# Patient Record
Sex: Male | Born: 1975 | Race: White | Hispanic: No | Marital: Married | Smoking: Former smoker
Health system: Southern US, Community
[De-identification: ages and names within clinical notes are randomized; demographics above are authoritative.]

---

## 2014-03-06 ENCOUNTER — Ambulatory Visit (INDEPENDENT_AMBULATORY_CARE_PROVIDER_SITE_OTHER): Payer: BLUE CROSS/BLUE SHIELD | Admitting: Family Medicine

## 2014-03-06 ENCOUNTER — Ambulatory Visit (INDEPENDENT_AMBULATORY_CARE_PROVIDER_SITE_OTHER): Payer: BLUE CROSS/BLUE SHIELD

## 2014-03-06 VITALS — BP 137/82 | HR 79 | Temp 98.6°F | Resp 16 | Ht 75.0 in | Wt 282.0 lb

## 2014-03-06 DIAGNOSIS — S92911A Unspecified fracture of right toe(s), initial encounter for closed fracture: Secondary | ICD-10-CM

## 2014-03-06 DIAGNOSIS — M25571 Pain in right ankle and joints of right foot: Secondary | ICD-10-CM

## 2014-03-06 IMAGING — CR DG FOOT COMPLETE 3+V*R*
3 series · 3 of 3 positions shown · non-contrast
Comparison: None.

CLINICAL DATA: Acute onset of right foot pain.  Initial encounter.

EXAM:
RIGHT FOOT COMPLETE - 3+ VIEW

[AP]
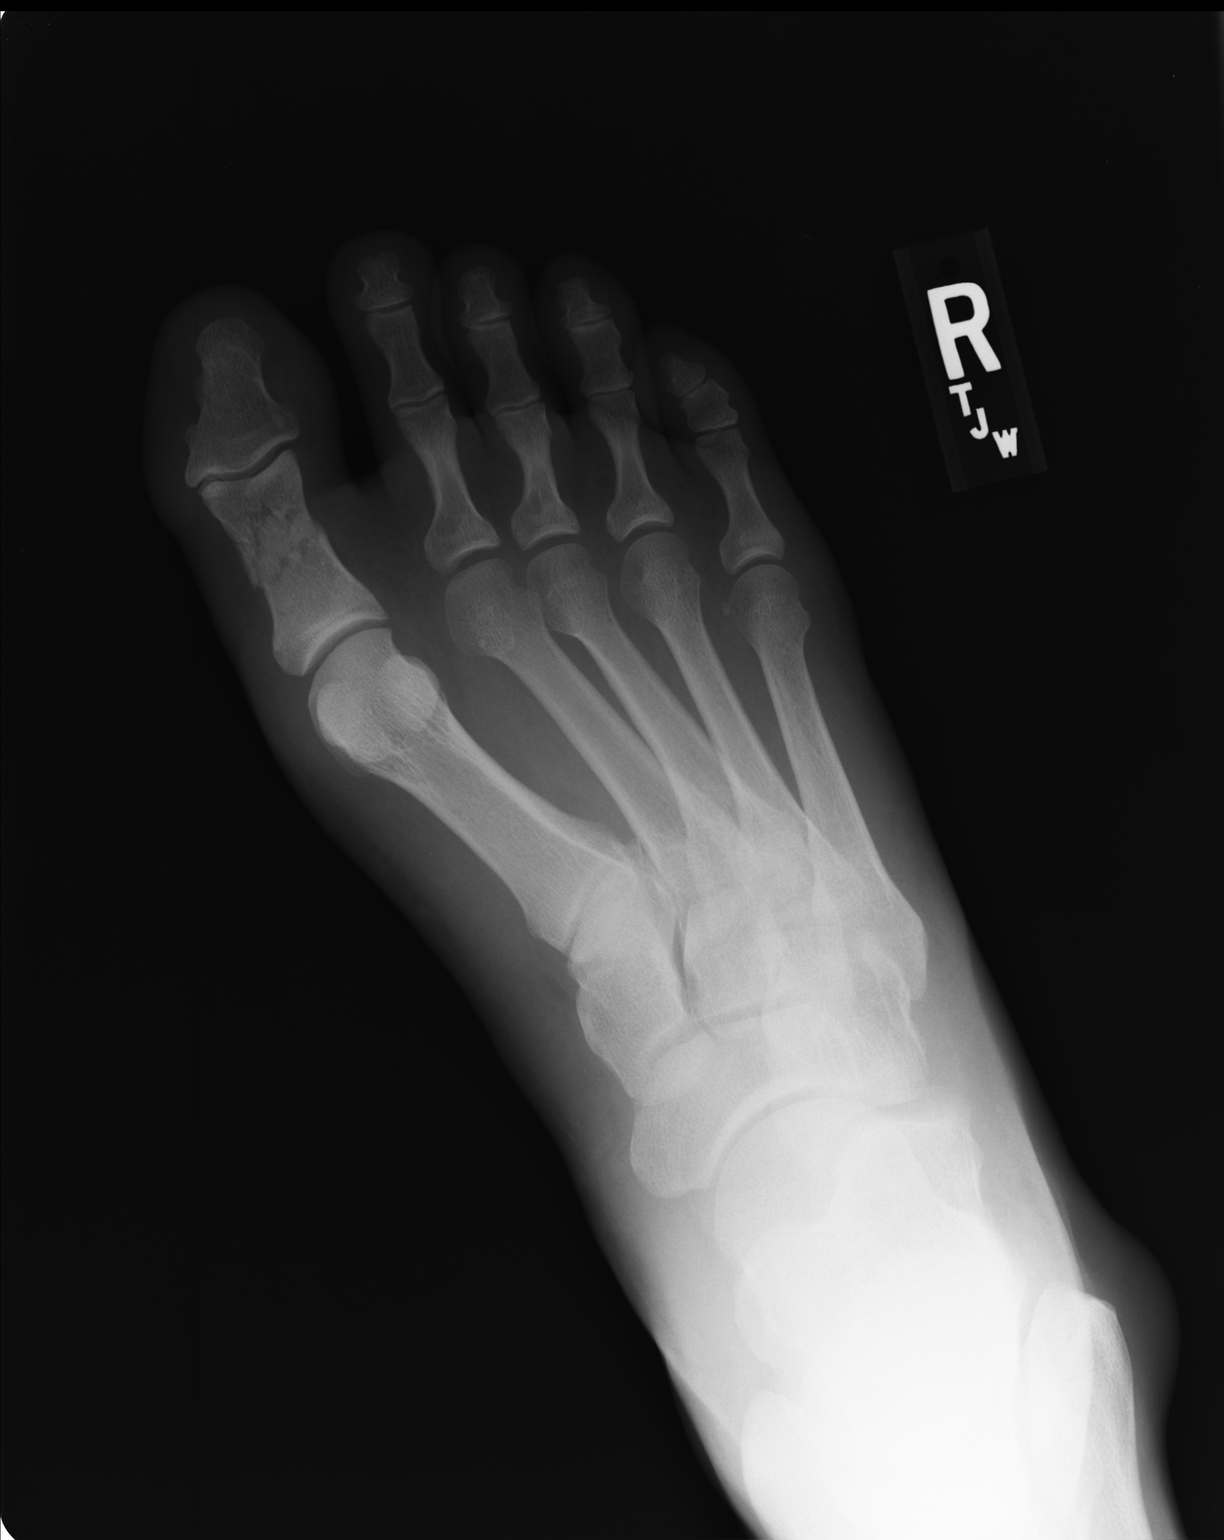

[ap obl int rot]
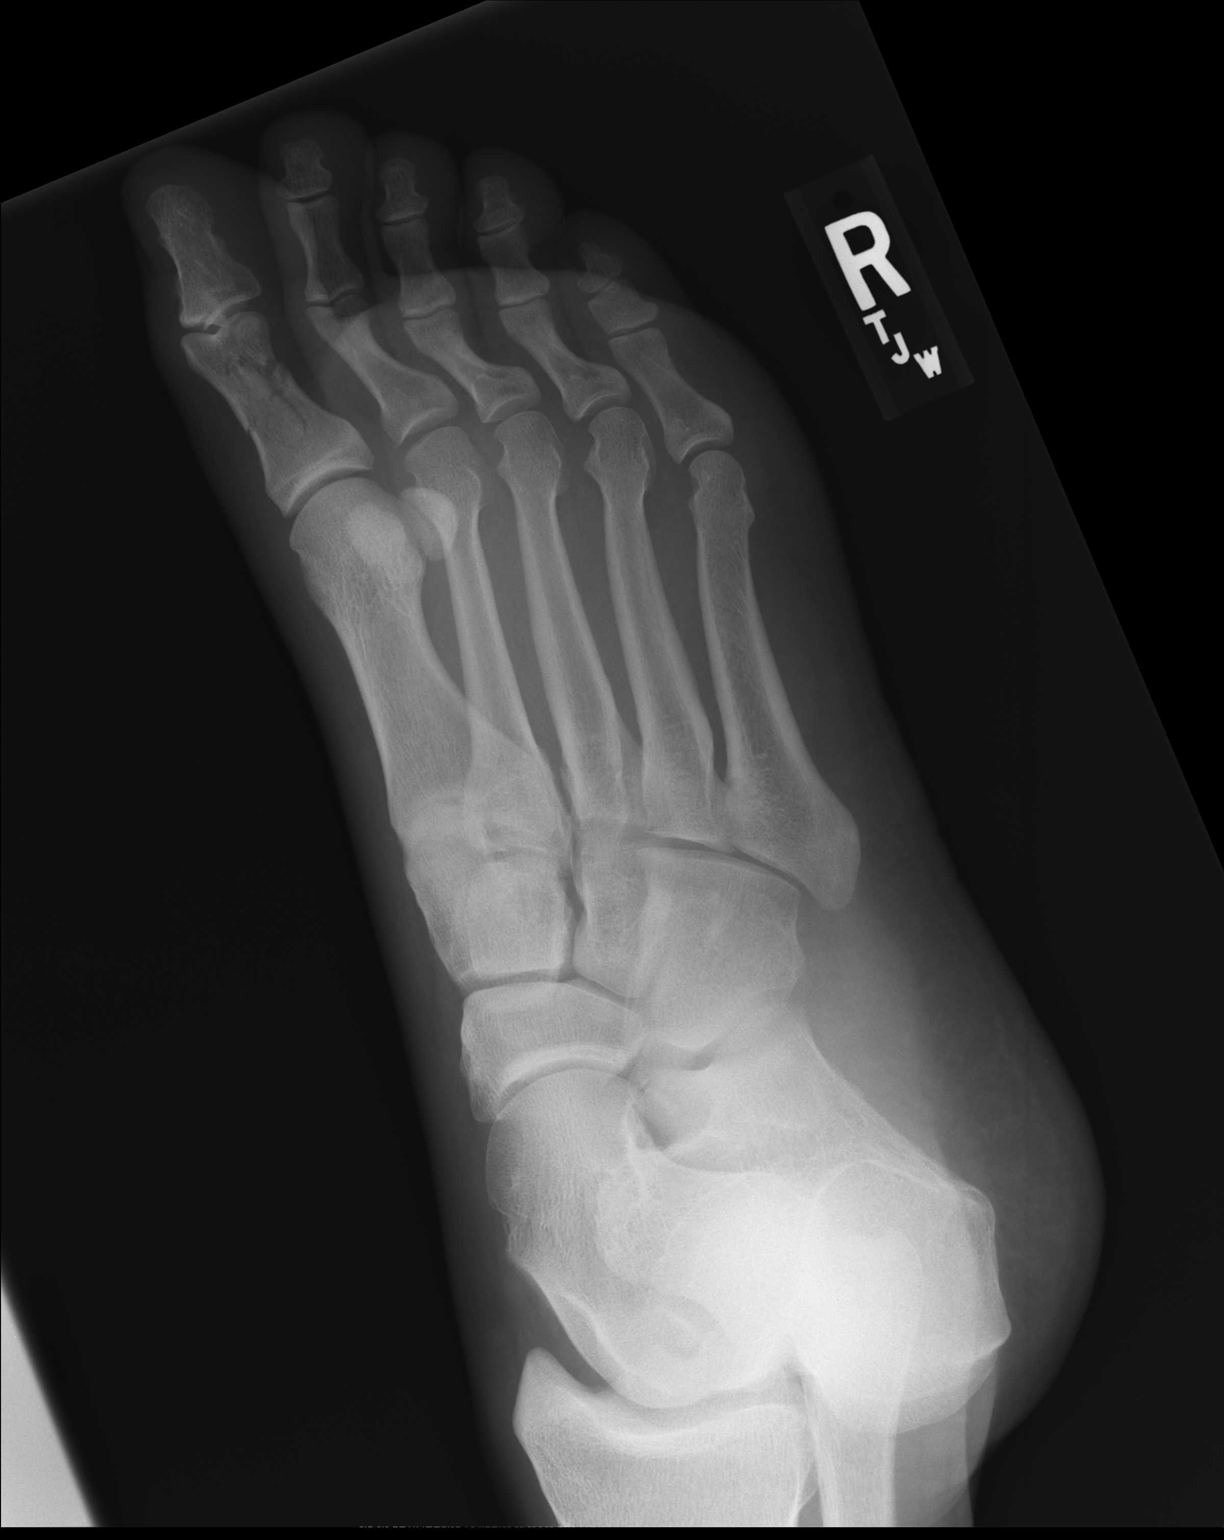

[lateral]
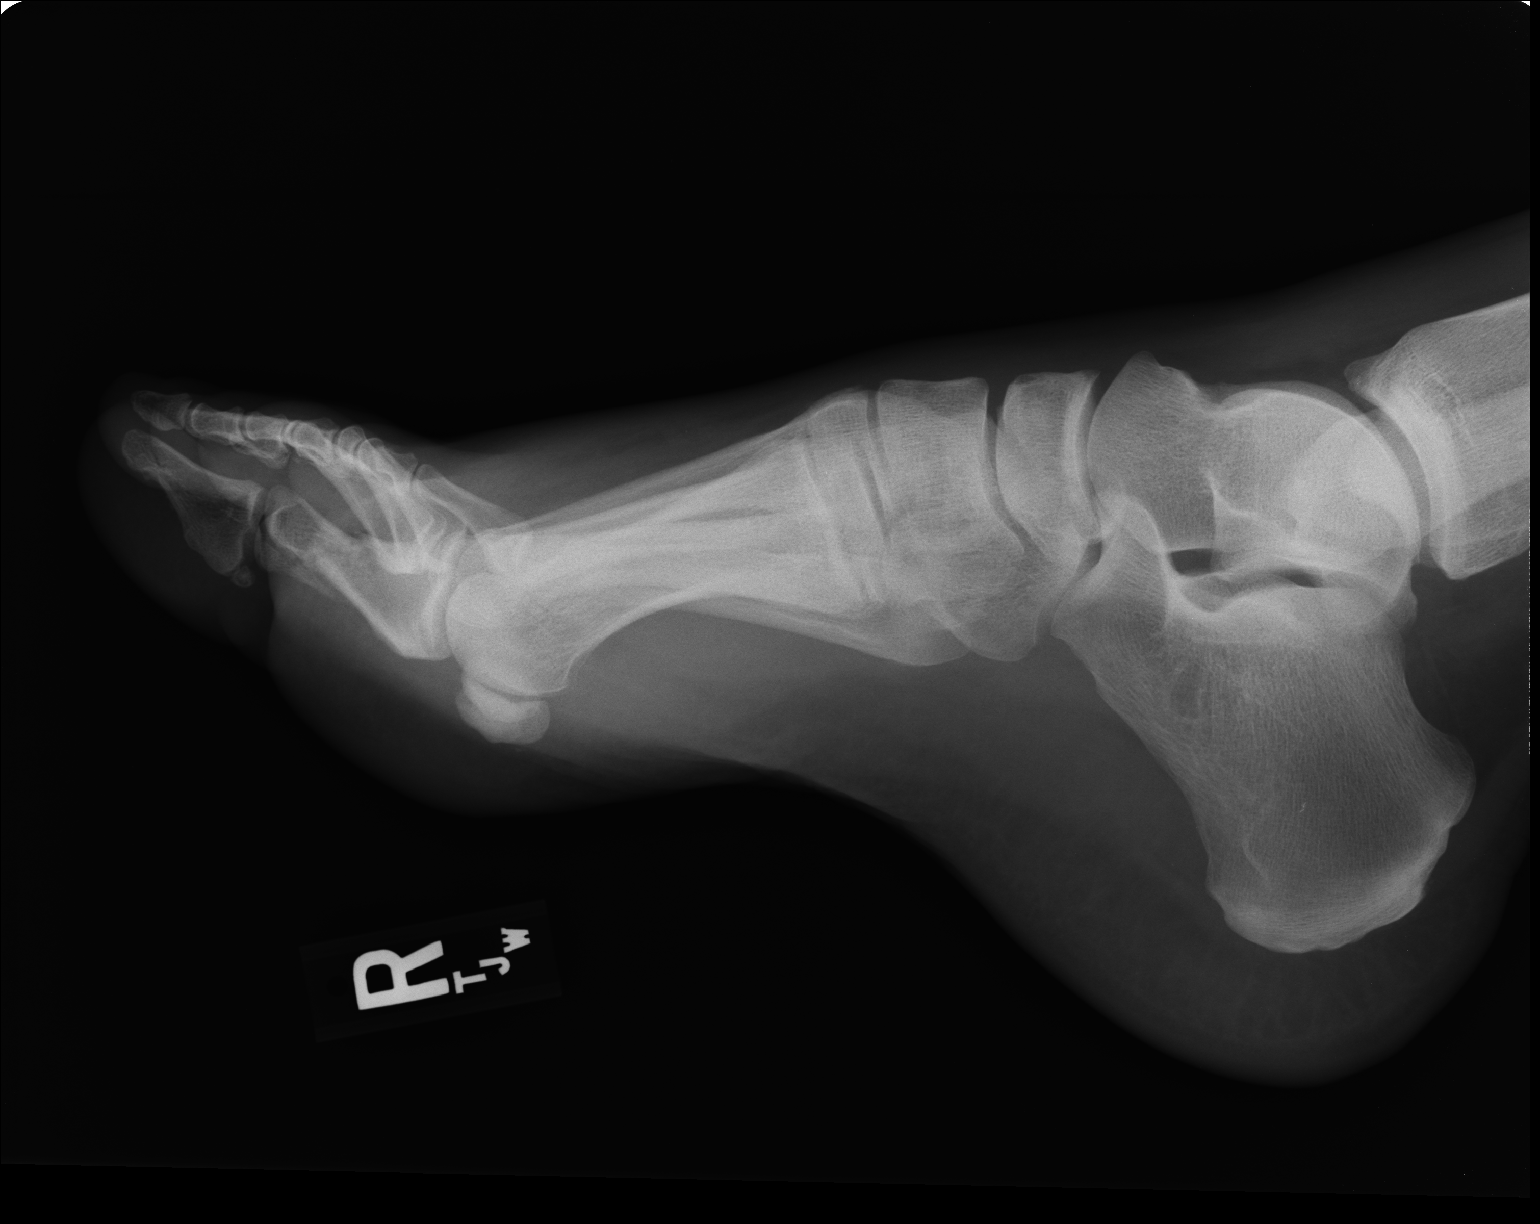

[3 of 3 positions shown; findings below may reference images not displayed]

FINDINGS: There is a comminuted fracture involving the first proximal phalanx,
with likely distal extension to the interphalangeal joint. No
definite proximal extension is seen. Mild associated plantar and
medial displacement is noted. Surrounding soft tissue swelling is
seen.

No additional fractures are identified. Visualized joint spaces are
otherwise preserved. The subtalar joint is unremarkable in
appearance.
IMPRESSION: Comminuted fracture involving the first proximal phalanx, with
likely distal extension to the interphalangeal joint. Mild
associated plantar and medial displacement noted.

## 2014-03-06 NOTE — Progress Notes (Signed)
Chief Complaint:  Chief Complaint  Patient presents with  . Toe Injury    200 lb object fell on big toe x 2 days ago    HPI: Daniel Molina is a 39 y.o. male who is here for  Foot pain and swelling after Railroad tie about 200 lbs fell on his foot, he has swelling and cannot bear weight, this happened  on Sunday He has been on crutches and also trying to elevate but sits at a desk all day, he denies numbness or tingling, swelling has gotten a little worse.  No prior injuries. He do esnot have pain, he ahs been takign ibuprofen for swelling. HE was not wearing steel toe shsoes, just wearing rubber boots due to snow  He is not a smoker. He drives about 3 hours to and from work in [place] so is not able to elevate as mucha s he would like  No past medical history on file. No past surgical history on file. History   Social History  . Marital Status: Married    Spouse Name: N/A    Number of Children: N/A  . Years of Education: N/A   Social History Main Topics  . Smoking status: Never Smoker   . Smokeless tobacco: None  . Alcohol Use: None  . Drug Use: None  . Sexual Activity: None   Other Topics Concern  . None   Social History Narrative  . None   No family history on file. No Known Allergies Prior to Admission medications   Not on File     ROS: The patient denies fevers, chills, night sweats, unintentional weight loss, chest pain, palpitations, wheezing, dyspnea on exertion, nausea, vomiting, abdominal pain, dysuria, hematuria, melena, numbness, weakness, or tingling.   All other systems have been reviewed and were otherwise negative with the exception of those mentioned in the HPI and as above.    PHYSICAL EXAM: Filed Vitals:   03/06/14 1814  BP: 137/82  Pulse: 79  Temp: 98.6 F (37 C)  Resp: 16   Filed Vitals:   03/06/14 1814  Height:  (1.905 m)  Weight: 282 lb (127.914 kg)   Body mass index is 35.25 kg/(m^2).  General: Alert, no acute  distress HEENT:  Normocephalic, atraumatic, oropharynx patent. EOMI, PERRLA Cardiovascular:  Regular rate and rhythm, no rubs murmurs or gallops.  No Carotid bruits, radial pulse intact. No pedal edema.  Respiratory: Clear to auscultation bilaterally.  No wheezes, rales, or rhonchi.  No cyanosis, no use of accessory musculature GI: No organomegaly, abdomen is soft and non-tender, positive bowel sounds.  No masses. Skin: + ecchymosis Neurologic: Facial musculature symmetric. Psychiatric: Patient is appropriate throughout our interaction. Lymphatic: No cervical lymphadenopathy Musculoskeletal: Gait intact. No e.o compartment syndrome, he has good DP and cap refill, sensation is normal Very tender at great toe  LABS: No results found for this or any previous visit.   EKG/XRAY:   Primary read interpreted by Dr. Conley Rolls at [facility]. Promximal phalanx fracture great toe multiple sites   ASSESSMENT/PLAN: Encounter Diagnoses  Name Primary?  . Pain in joint, ankle and foot, right   . Phalanx of the foot fracture, right, closed, initial encounter Yes   Post op shoe applied in office, may use Crutches he already has Nonweightbearing, advise to monitor for compartment syndrome since crush injury Did not want any pain meds Elevate as much as possible Refer to ortho  Gross sideeffects, risk and benefits, and alternatives of medications  d/w patient. Patient is aware that all medications have potential sideeffects and we are unable to predict every sideeffect or drug-drug interaction that may occur.  LE, THAO PHUONG, DO 03/06/2014 8:10 PM

## 2014-09-27 ENCOUNTER — Ambulatory Visit (INDEPENDENT_AMBULATORY_CARE_PROVIDER_SITE_OTHER): Payer: BLUE CROSS/BLUE SHIELD | Admitting: Family Medicine

## 2014-09-27 VITALS — BP 132/82 | HR 66 | Temp 98.5°F | Resp 16 | Ht 72.0 in | Wt 282.4 lb

## 2014-09-27 DIAGNOSIS — J351 Hypertrophy of tonsils: Secondary | ICD-10-CM | POA: Diagnosis not present

## 2014-09-27 DIAGNOSIS — R0683 Snoring: Secondary | ICD-10-CM

## 2014-09-27 DIAGNOSIS — R05 Cough: Secondary | ICD-10-CM

## 2014-09-27 DIAGNOSIS — R1310 Dysphagia, unspecified: Secondary | ICD-10-CM | POA: Diagnosis not present

## 2014-09-27 DIAGNOSIS — G4710 Hypersomnia, unspecified: Secondary | ICD-10-CM | POA: Diagnosis not present

## 2014-09-27 DIAGNOSIS — R400 Somnolence: Secondary | ICD-10-CM

## 2014-09-27 DIAGNOSIS — K219 Gastro-esophageal reflux disease without esophagitis: Secondary | ICD-10-CM | POA: Diagnosis not present

## 2014-09-27 DIAGNOSIS — R059 Cough, unspecified: Secondary | ICD-10-CM

## 2014-09-27 MED ORDER — RANITIDINE HCL 150 MG PO TABS: 150.0000 mg | ORAL_TABLET | Freq: Two times a day (BID) | ORAL | Status: AC

## 2014-09-27 NOTE — Progress Notes (Signed)
History and physical examinations obtained with Dr. Merla Riches.  Agree with assessment and plan. Kristi Paulita Fujita, M.D. [facility] [address] [place], [place]  [address] [phone] phone [phone] fax

## 2014-09-27 NOTE — Patient Instructions (Addendum)
Cough could be a combination of multiple things I think there is a component of reflux, irritated by smoking  Also the length of the uvula and large tonsils could be playing a part I am going to refer you to ENT for evaluation  For your snoring, stopping breathing at night, and daytime sleepiness I am going to refer you for a sleep study We will call you as soon as those appts are set up

## 2014-09-27 NOTE — Progress Notes (Signed)
Subjective:    Patient ID: Daniel Molina, male    DOB: [birth year], 39 y.o.   MRN: [id number]  HPI    Review of Systems     Objective:   Physical Exam        Assessment & Plan:   [facility] [address], [place] [address] [id number]  Date:  09/27/2014   Name:  Ralf Konopka   DOB:  10/07/1975   MRN:  [id number]  PCP:  Pcp Not In System    Chief Complaint: Cough   History of Present Illness:  Daniel Molina is a 39 y.o. 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, large tonsils, and deviated septum Feels like the papilloma has increased in size characterized as dry cough No trouble liquid but some trouble swallowing food x 2-3 years which he associates with reflux No fevers, no congestion, no ear pain, no facial pressure, no trouble with vision, no rhinorrhea No chest pain, no palpitations, no tachy/brady, no SOB, no abd pain/ no N/V/D Smokes 1 pack/week, cough worse with smoking Not on any medications No other medical issues  + reflux worse at night with burning and cough, takes tums with no relief Has been a chronic issue but only taking tums Trouble laying flat at night Trouble with spicy foods  +daytime somnolence, snoring at night, periods of apnea at night per pt's wife Never evaluated in the past      There are no active problems to display for this patient.   History reviewed. No pertinent past medical history.  History reviewed. No pertinent past surgical history.  Social History  Substance Use Topics  . Smoking status: Former Games developer  . Smokeless tobacco: None     Comment: smokes on and off  . Alcohol Use: No    History reviewed. No pertinent family history.  No Known Allergies  Medication list has been reviewed and updated.  No current outpatient prescriptions on file prior to visit.   No current  facility-administered medications on file prior to visit.    Review of Systems: All other pertinent ROS negative except as seen in the HPI above  Physical Examination: Filed Vitals:   09/27/14 1735  BP: 132/82  Pulse: 66  Temp: 98.5 F (36.9 C)  Resp: 16   Filed Vitals:   09/27/14 1735  Height: 6' (1.829 m)  Weight: 282 lb 6 oz (128.084 kg)   Body mass index is 38.29 kg/(m^2). Ideal Body Weight: Weight in (lb) to have BMI = 25: 183.9  GEN: WDWN, NAD, Non-toxic, A & O x 3 HEENT: Atraumatic, Normocephalic. Neck supple, No LAD. Large uvula hanging down onto tongue, tonsillar hypertrophy bilaterally with multiple tonsilliths, no signs of exudate, small appendage hanging of posterior left uvula, no pharyngeal erthema   Ears and Nose: No external deformity. Deviated septum, non erythematous turbinates CV: RRR, No M/G/R. No JVD. No thrill. No extra heart sounds. PULM: CTAB, no wheezes, crackles, rhonchi. No retractions. No resp. distress. No accessory muscle use. ABD: S, NT, ND, +BS. No rebound. No HSM. EXTR: No c/c/e NEURO Normal gait.  PSYCH: Normally interactive. Conversant. Not depressed or anxious appearing.  Calm demeanor.    Assessment and Plan: Gastroesophageal reflux disease, esophagitis presence not specified - Plan: ranitidine (ZANTAC) 150 MG tablet  Cough - Plan: Ambulatory referral to ENT  Difficulty swallowing -  Plan: Ambulatory referral to ENT  Lingual tonsil hypertrophy - Plan: Ambulatory referral to ENT  Daytime somnolence - Plan: Nocturnal polysomnography (NPSG)  Snoring - Plan: Nocturnal polysomnography (NPSG)  Cough likely a combination of multiple things There is a known component of reflux which is not well controlled with tums Will initiate trial of ranitidine 150 BID with consideration of moving up in class to PPI if no relief Likely the esophagus/oropharynx is irritated by smoking, counseled by quiting  I also believe the length of the uvula and  large tonsils could be playing a part Will refer pt to ENT for evaluation of tonsillar hypertrophy, uvular hypertrophy, lesions on uvula and deviated septum Will refer for sleep study to evaluate likely sleep apnea  For your snoring, stopping breathing at night, and daytime sleepiness I am going to refer you for a sleep study We will call you as soon as those appts are set up   Signed Ben Adams-Doolittle, DO

## 2014-10-02 ENCOUNTER — Other Ambulatory Visit: Payer: Self-pay

## 2014-10-02 DIAGNOSIS — R400 Somnolence: Secondary | ICD-10-CM

## 2014-10-02 DIAGNOSIS — R0683 Snoring: Secondary | ICD-10-CM

## 2014-12-17 ENCOUNTER — Institutional Professional Consult (permissible substitution): Payer: BLUE CROSS/BLUE SHIELD | Admitting: Neurology

## 2014-12-17 DIAGNOSIS — Z0289 Encounter for other administrative examinations: Secondary | ICD-10-CM

## 2017-02-22 DIAGNOSIS — M25512 Pain in left shoulder: Secondary | ICD-10-CM | POA: Diagnosis not present

## 2017-02-22 DIAGNOSIS — M25511 Pain in right shoulder: Secondary | ICD-10-CM | POA: Diagnosis not present

## 2017-02-22 DIAGNOSIS — M19011 Primary osteoarthritis, right shoulder: Secondary | ICD-10-CM | POA: Diagnosis not present

## 2017-03-24 DIAGNOSIS — M19011 Primary osteoarthritis, right shoulder: Secondary | ICD-10-CM | POA: Diagnosis not present

## 2017-03-24 DIAGNOSIS — M67911 Unspecified disorder of synovium and tendon, right shoulder: Secondary | ICD-10-CM | POA: Diagnosis not present

## 2017-04-01 DIAGNOSIS — M25511 Pain in right shoulder: Secondary | ICD-10-CM | POA: Diagnosis not present

## 2017-04-05 DIAGNOSIS — M19011 Primary osteoarthritis, right shoulder: Secondary | ICD-10-CM | POA: Diagnosis not present

## 2017-11-18 DIAGNOSIS — R591 Generalized enlarged lymph nodes: Secondary | ICD-10-CM | POA: Diagnosis not present

## 2017-11-18 DIAGNOSIS — Z23 Encounter for immunization: Secondary | ICD-10-CM | POA: Diagnosis not present

## 2017-11-18 DIAGNOSIS — Z720 Tobacco use: Secondary | ICD-10-CM | POA: Diagnosis not present

## 2017-11-18 DIAGNOSIS — J029 Acute pharyngitis, unspecified: Secondary | ICD-10-CM | POA: Diagnosis not present

## 2017-12-30 DIAGNOSIS — R221 Localized swelling, mass and lump, neck: Secondary | ICD-10-CM | POA: Diagnosis not present

## 2017-12-30 DIAGNOSIS — J039 Acute tonsillitis, unspecified: Secondary | ICD-10-CM | POA: Diagnosis not present

## 2018-04-07 DIAGNOSIS — Z87891 Personal history of nicotine dependence: Secondary | ICD-10-CM | POA: Diagnosis not present

## 2018-04-07 DIAGNOSIS — R221 Localized swelling, mass and lump, neck: Secondary | ICD-10-CM | POA: Diagnosis not present

## 2018-04-07 DIAGNOSIS — K219 Gastro-esophageal reflux disease without esophagitis: Secondary | ICD-10-CM | POA: Diagnosis not present

## 2018-04-18 ENCOUNTER — Other Ambulatory Visit: Payer: Self-pay | Admitting: Otolaryngology

## 2018-04-18 DIAGNOSIS — R221 Localized swelling, mass and lump, neck: Secondary | ICD-10-CM

## 2018-04-26 ENCOUNTER — Other Ambulatory Visit: Payer: Self-pay | Admitting: Otolaryngology

## 2018-04-26 DIAGNOSIS — R221 Localized swelling, mass and lump, neck: Secondary | ICD-10-CM

## 2018-04-28 ENCOUNTER — Ambulatory Visit
Admission: RE | Admit: 2018-04-28 | Discharge: 2018-04-28 | Disposition: A | Discharge status: Home / Self Care | Payer: BLUE CROSS/BLUE SHIELD | Source: Ambulatory Visit | Attending: Otolaryngology | Admitting: Otolaryngology

## 2018-04-28 DIAGNOSIS — R221 Localized swelling, mass and lump, neck: Secondary | ICD-10-CM | POA: Diagnosis not present

## 2018-04-28 MED ORDER — IOPAMIDOL (ISOVUE-300) INJECTION 61%
75.0000 mL | Freq: Once | INTRAVENOUS | Status: AC | PRN
  Administered 2018-04-28: 75 mL via INTRAVENOUS

## 2022-12-11 DIAGNOSIS — Z125 Encounter for screening for malignant neoplasm of prostate: Secondary | ICD-10-CM | POA: Diagnosis not present

## 2022-12-11 DIAGNOSIS — Z0000 Encounter for general adult medical examination without abnormal findings: Secondary | ICD-10-CM | POA: Diagnosis not present

## 2022-12-11 DIAGNOSIS — I10 Essential (primary) hypertension: Secondary | ICD-10-CM | POA: Diagnosis not present

## 2022-12-11 DIAGNOSIS — Z23 Encounter for immunization: Secondary | ICD-10-CM | POA: Diagnosis not present

## 2023-02-19 DIAGNOSIS — Z1211 Encounter for screening for malignant neoplasm of colon: Secondary | ICD-10-CM | POA: Diagnosis not present

## 2023-02-19 DIAGNOSIS — Z1212 Encounter for screening for malignant neoplasm of rectum: Secondary | ICD-10-CM | POA: Diagnosis not present

## 2023-10-19 ENCOUNTER — Other Ambulatory Visit: Payer: Self-pay

## 2023-10-19 ENCOUNTER — Ambulatory Visit (INDEPENDENT_AMBULATORY_CARE_PROVIDER_SITE_OTHER)

## 2023-10-19 ENCOUNTER — Encounter: Payer: Self-pay | Admitting: Emergency Medicine

## 2023-10-19 ENCOUNTER — Ambulatory Visit
Admission: EM | Admit: 2023-10-19 | Discharge: 2023-10-19 | Disposition: A | Discharge status: Home / Self Care | Attending: Internal Medicine | Admitting: Internal Medicine

## 2023-10-19 DIAGNOSIS — M65841 Other synovitis and tenosynovitis, right hand: Secondary | ICD-10-CM | POA: Diagnosis not present

## 2023-10-19 DIAGNOSIS — M778 Other enthesopathies, not elsewhere classified: Secondary | ICD-10-CM

## 2023-10-19 MED ORDER — PREDNISONE 20 MG PO TABS: 40.0000 mg | ORAL_TABLET | Freq: Every day | ORAL | 0 refills | Status: AC

## 2023-10-19 NOTE — ED Triage Notes (Signed)
 Pt here for right hand pain after bowling 2 weeks ago; pt sts feels like hand is broken

## 2023-10-19 NOTE — ED Provider Notes (Incomplete)
 [facility]    CSN: [id number] Arrival date & time: 10/19/23  1743      History   Chief Complaint Chief Complaint  Patient presents with   Hand Pain    HPI Daniel Molina is a 48 y.o. male.   Daniel Molina is a 48 y.o. male presenting for chief complaint of right hand pain that started 2 weeks ago after bowling. He does not usually bowl very often. Picked up a bowling ball with the right hand/fingers and was only able to bowl 2-3 times due to pain and swelling of the right hand.   Pain is located over the fourth right metacarpal to the mid palm of the hand and radiates distally to the right fourth digit MCP/PIP joint. Pain is triggered by hand movement and making a grip motion with the hand.   Denies new trauma/injuries to the hand and previous injuries.   No paresthesias distally, warmth, open wounds, or color/temperature changes distally.   He stopped bowling when he felt the pain and has not attempted use of any OTC medications at home for pain.    Hand Pain    History reviewed. No pertinent past medical history.  There are no active problems to display for this patient.   History reviewed. No pertinent surgical history.     Home Medications    Prior to Admission medications   Medication Sig Start Date End Date Taking? Authorizing Provider  predniSONE  (DELTASONE ) 20 MG tablet Take 2 tablets (40 mg total) by mouth daily with breakfast for 5 days. 10/19/23 10/24/23 Yes StanhopeDorna HERO, FNP  ranitidine  (ZANTAC ) 150 MG tablet Take 1 tablet (150 mg total) by mouth 2 (two) times daily. 09/27/14   Adams-Doolittle, Morene, MD    Family History History reviewed. No pertinent family history.  Social History Social History   Tobacco Use   Smoking status: Former   Tobacco comments:    smokes on and off  Substance Use Topics   Alcohol use: No    Alcohol/week: 0.0 standard drinks of alcohol   Drug use: No     Allergies   Patient has no known  allergies.   Review of Systems Review of Systems Per HPI  Physical Exam Triage Vital Signs ED Triage Vitals  Encounter Vitals Group     BP 10/19/23 1842 (!) 159/101     Girls Systolic BP Percentile --      Girls Diastolic BP Percentile --      Boys Systolic BP Percentile --      Boys Diastolic BP Percentile --      Pulse Rate 10/19/23 1842 74     Resp 10/19/23 1842 18     Temp 10/19/23 1842 98.2 F (36.8 C)     Temp Source 10/19/23 1842 Oral     SpO2 10/19/23 1842 95 %     Weight --      Height --      Head Circumference --      Peak Flow --      Pain Score 10/19/23 1843 4     Pain Loc --      Pain Education --      Exclude from Growth Chart --    No data found.  Updated Vital Signs BP (!) 159/101 (BP Location: Left Arm)   Pulse 74   Temp 98.2 F (36.8 C) (Oral)   Resp 18   SpO2 95%   Visual Acuity Right Eye Distance:   Left  Eye Distance:   Bilateral Distance:    Right Eye Near:   Left Eye Near:    Bilateral Near:     Physical Exam Vitals and nursing note reviewed.  Constitutional:      Appearance: He is not ill-appearing or toxic-appearing.  HENT:     Head: Normocephalic and atraumatic.     Right Ear: Hearing and external ear normal.     Left Ear: Hearing and external ear normal.     Nose: Nose normal.     Mouth/Throat:     Lips: Pink.  Eyes:     General: Lids are normal. Vision grossly intact. Gaze aligned appropriately.     Extraocular Movements: Extraocular movements intact.     Conjunctiva/sclera: Conjunctivae normal.  Pulmonary:     Effort: Pulmonary effort is normal.  Musculoskeletal:     Right hand: Swelling (Minimal swelling over the right fourth MCP/DIP) and tenderness present. No deformity, lacerations or bony tenderness. Decreased range of motion (Decreased ROM of right fourth digit with flexion, able to make fist with right hand, strength intact to distal affected digit). Normal strength. Normal sensation. There is no disruption of  two-point discrimination. Normal capillary refill. Normal pulse.       Hands:     Cervical back: Neck supple.     Comments: +2 right radial pulse, less than 2 cap refill.    Skin:    General: Skin is warm and dry.     Capillary Refill: Capillary refill takes less than 2 seconds.     Findings: No rash.  Neurological:     General: No focal deficit present.     Mental Status: He is alert and oriented to person, place, and time. Mental status is at baseline.     Cranial Nerves: No dysarthria or facial asymmetry.  Psychiatric:        Mood and Affect: Mood normal.        Speech: Speech normal.        Behavior: Behavior normal.        Thought Content: Thought content normal.        Judgment: Judgment normal.      UC Treatments / Results  Labs (all labs ordered are listed, but only abnormal results are displayed) Labs Reviewed - No data to display  EKG   Radiology DG Hand Complete Right Result Date: 10/19/2023 CLINICAL DATA:  Trauma to the right hand. EXAM: RIGHT HAND - COMPLETE 3+ VIEW COMPARISON:  None Available. FINDINGS: There is no evidence of fracture or dislocation. There is no evidence of arthropathy or other focal bone abnormality. Soft tissues are unremarkable. IMPRESSION: Negative. Electronically Signed   By: Vanetta Chou M.D.   On: 10/19/2023 18:57    Procedures Procedures (including critical care time)  Medications Ordered in UC Medications - No data to display  Initial Impression / Assessment and Plan / UC Course  I have reviewed the triage vital signs and the nursing notes.  Pertinent labs & imaging results that were available during my care of the patient were reviewed by me and considered in my medical decision making (see chart for details).   1. Tendinitis of right hand Presentation consistent with tendinitis of the right hand secondary to injury while bowling.  Right hand x-ray negative for signs of acute bony abnormality.  Recommend  ice/elevation/compression. Prednisone  burst ordered to reduce pain/swelling.  History of essential hypertension, he is not a candidate for NSAID use.   Follow-up with hand specialist  as needed.   Counseled patient on potential for adverse effects with medications prescribed/recommended today, strict ER and return-to-clinic precautions discussed, patient verbalized understanding.     Final Clinical Impressions(s) / UC Diagnoses   Final diagnoses:  Tendinitis of right hand     Discharge Instructions      Take prednisone  once daily for 5 days starting tomorrow.  Ice and elevate the hand.  Follow-up with hand specialist.   If you develop any new or worsening symptoms or if your symptoms do not start to improve, please return here or follow-up with your primary care provider. If your symptoms are severe, please go to the emergency room.     ED Prescriptions     Medication Sig Dispense Auth. Provider   predniSONE  (DELTASONE ) 20 MG tablet Take 2 tablets (40 mg total) by mouth daily with breakfast for 5 days. 10 tablet Enedelia Dorna HERO, FNP      PDMP not reviewed this encounter.   Enedelia Dorna HERO, FNP 10/21/23 1042    Enedelia Dorna HERO, FNP 10/21/23 1043

## 2023-10-19 NOTE — Discharge Instructions (Addendum)
 Take prednisone  once daily for 5 days starting tomorrow.  Ice and elevate the hand.  Follow-up with hand specialist.   If you develop any new or worsening symptoms or if your symptoms do not start to improve, please return here or follow-up with your primary care provider. If your symptoms are severe, please go to the emergency room.

## 2023-12-16 DIAGNOSIS — E669 Obesity, unspecified: Secondary | ICD-10-CM | POA: Diagnosis not present

## 2023-12-16 DIAGNOSIS — Z125 Encounter for screening for malignant neoplasm of prostate: Secondary | ICD-10-CM | POA: Diagnosis not present

## 2023-12-16 DIAGNOSIS — R030 Elevated blood-pressure reading, without diagnosis of hypertension: Secondary | ICD-10-CM | POA: Diagnosis not present

## 2023-12-16 DIAGNOSIS — Z1331 Encounter for screening for depression: Secondary | ICD-10-CM | POA: Diagnosis not present

## 2023-12-16 DIAGNOSIS — Z8249 Family history of ischemic heart disease and other diseases of the circulatory system: Secondary | ICD-10-CM | POA: Diagnosis not present

## 2023-12-16 DIAGNOSIS — R7301 Impaired fasting glucose: Secondary | ICD-10-CM | POA: Diagnosis not present

## 2023-12-16 DIAGNOSIS — Z13220 Encounter for screening for lipoid disorders: Secondary | ICD-10-CM | POA: Diagnosis not present

## 2023-12-16 DIAGNOSIS — Z0000 Encounter for general adult medical examination without abnormal findings: Secondary | ICD-10-CM | POA: Diagnosis not present

## 2023-12-16 DIAGNOSIS — Z87891 Personal history of nicotine dependence: Secondary | ICD-10-CM | POA: Diagnosis not present

## 2024-01-03 ENCOUNTER — Other Ambulatory Visit (HOSPITAL_BASED_OUTPATIENT_CLINIC_OR_DEPARTMENT_OTHER): Payer: Self-pay | Admitting: Physician Assistant

## 2024-01-03 DIAGNOSIS — Z8249 Family history of ischemic heart disease and other diseases of the circulatory system: Secondary | ICD-10-CM

## 2024-01-19 ENCOUNTER — Ambulatory Visit (HOSPITAL_BASED_OUTPATIENT_CLINIC_OR_DEPARTMENT_OTHER)
Admission: RE | Admit: 2024-01-19 | Discharge: 2024-01-19 | Disposition: A | Discharge status: Home / Self Care | Payer: Self-pay | Source: Ambulatory Visit | Attending: Physician Assistant | Admitting: Physician Assistant

## 2024-01-19 DIAGNOSIS — Z8249 Family history of ischemic heart disease and other diseases of the circulatory system: Secondary | ICD-10-CM | POA: Insufficient documentation
